# Patient Record
Sex: Male | Born: 1995 | Race: Black or African American | Hispanic: No | Marital: Single | Smoking: Current some day smoker
Health system: Southern US, Community
[De-identification: ages and names within clinical notes are randomized; demographics above are authoritative.]

## PROBLEM LIST (undated history)

## (undated) DIAGNOSIS — S66901A Unspecified injury of unspecified muscle, fascia and tendon at wrist and hand level, right hand, initial encounter: Secondary | ICD-10-CM

## (undated) DIAGNOSIS — F1290 Cannabis use, unspecified, uncomplicated: Secondary | ICD-10-CM

## (undated) DIAGNOSIS — W3400XA Accidental discharge from unspecified firearms or gun, initial encounter: Secondary | ICD-10-CM

## (undated) DIAGNOSIS — F17200 Nicotine dependence, unspecified, uncomplicated: Secondary | ICD-10-CM

## (undated) DIAGNOSIS — S52022B Displaced fracture of olecranon process without intraarticular extension of left ulna, initial encounter for open fracture type I or II: Secondary | ICD-10-CM

## (undated) DIAGNOSIS — S6291XB Unspecified fracture of right wrist and hand, initial encounter for open fracture: Secondary | ICD-10-CM

---
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
## 2018-02-04 ENCOUNTER — Encounter (HOSPITAL_COMMUNITY): Payer: Self-pay | Admitting: *Deleted

## 2018-02-04 ENCOUNTER — Emergency Department (HOSPITAL_COMMUNITY)
Admission: EM | Admit: 2018-02-04 | Discharge: 2018-02-04 | Disposition: A | Discharge status: Home / Self Care | Payer: BLUE CROSS/BLUE SHIELD | Attending: Emergency Medicine | Admitting: Emergency Medicine

## 2018-02-04 ENCOUNTER — Other Ambulatory Visit: Payer: Self-pay

## 2018-02-04 DIAGNOSIS — M791 Myalgia, unspecified site: Secondary | ICD-10-CM | POA: Insufficient documentation

## 2018-02-04 DIAGNOSIS — R509 Fever, unspecified: Secondary | ICD-10-CM | POA: Diagnosis present

## 2018-02-04 DIAGNOSIS — R05 Cough: Secondary | ICD-10-CM | POA: Diagnosis not present

## 2018-02-04 DIAGNOSIS — R69 Illness, unspecified: Secondary | ICD-10-CM

## 2018-02-04 DIAGNOSIS — Z79899 Other long term (current) drug therapy: Secondary | ICD-10-CM | POA: Insufficient documentation

## 2018-02-04 DIAGNOSIS — J111 Influenza due to unidentified influenza virus with other respiratory manifestations: Secondary | ICD-10-CM | POA: Insufficient documentation

## 2018-02-04 DIAGNOSIS — R1110 Vomiting, unspecified: Secondary | ICD-10-CM | POA: Diagnosis not present

## 2018-02-04 DIAGNOSIS — R070 Pain in throat: Secondary | ICD-10-CM | POA: Diagnosis not present

## 2018-02-04 LAB — RAPID STREP SCREEN (MED CTR MEBANE ONLY): Streptococcus, Group A Screen (Direct): NEGATIVE

## 2018-02-04 MED ORDER — ACETAMINOPHEN 500 MG PO TABS
1000.0000 mg | ORAL_TABLET | Freq: Once | ORAL | Status: AC | PRN
  Administered 2018-02-04: 975 mg via ORAL

## 2018-02-04 MED ORDER — ACETAMINOPHEN 325 MG PO TABS
650.0000 mg | ORAL_TABLET | Freq: Once | ORAL | Status: DC | PRN
  Filled 2018-02-04 (×2): qty 2

## 2018-02-04 MED ORDER — OSELTAMIVIR PHOSPHATE 75 MG PO CAPS: 75.0000 mg | ORAL_CAPSULE | Freq: Two times a day (BID) | ORAL | 0 refills | Status: DC

## 2018-02-04 NOTE — ED Provider Notes (Signed)
Patient placed in Quick Look pathway, seen and evaluated   Chief Complaint: Influenza-like symptoms  HPI:   Patient resents the ED with flulike symptoms.  Complains of headache, fever, sore throat, cough, body aches and rhinorrhea.  States that his symptoms started approximately 3 days ago.  Does report an episode of vomiting last night.  Denies any associated diarrhea or abdominal pain.  Has not taking for symptoms day prior to arrival.  Denies any known sick contacts.  Did not receive influenza vaccine.  ROS: Fever, chills, body aches, sore throat, cough, rhinorrhea (one)  Physical Exam:   Gen: No distress  Neuro: Awake and Alert  Skin: Warm    Focused Exam: TMs are normal.  Oropharynx with mild erythema no edema or exudate.  Rhinorrhea present.  Lungs clear to auscultation bilaterally.  Tachycardia noted otherwise regular rhythm without any rubs murmurs or gallops.  No focal abdominal tenderness.  No cervical lymphadenopathy.  No meningeal signs.   Initiation of care has begun. The patient has been counseled on the process, plan, and necessity for staying for the completion/evaluation, and the remainder of the medical screening examination   Discussed with the patient that exiting the department prior to completion of the work-up is AMA and there is no guarantee that there are no emergency medical conditions present.     Rise MuLeaphart, Shonte Soderlund T, PA-C 02/04/18 1448    Donnetta Hutchingook, Brian, MD 02/05/18 [phone]

## 2018-02-04 NOTE — ED Notes (Signed)
Pt. alert & interactive during discharge; pt. ambulatory to exit 

## 2018-02-04 NOTE — ED Triage Notes (Signed)
Pt reports flu like symptoms x 3 days with headache, fever, sore throat, cough and body aches. Had n/v last night. Mask on pt at triage.
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
## 2018-02-04 NOTE — Discharge Instructions (Signed)
Return to the ED with any concerns including difficulty breathing, vomiting and not able to keep down liquids, decreased urine output, decreased level of alertness/lethargy, or any other alarming symptoms  °

## 2018-02-07 LAB — CULTURE, GROUP A STREP (THRC)

## 2020-07-18 ENCOUNTER — Emergency Department (HOSPITAL_BASED_OUTPATIENT_CLINIC_OR_DEPARTMENT_OTHER)
Admission: EM | Admit: 2020-07-18 | Discharge: 2020-07-18 | Disposition: A | Discharge status: Home / Self Care | Payer: BC Managed Care – PPO | Attending: Emergency Medicine | Admitting: Emergency Medicine

## 2020-07-18 ENCOUNTER — Other Ambulatory Visit: Payer: Self-pay

## 2020-07-18 ENCOUNTER — Emergency Department (HOSPITAL_BASED_OUTPATIENT_CLINIC_OR_DEPARTMENT_OTHER): Payer: BC Managed Care – PPO

## 2020-07-18 ENCOUNTER — Encounter (HOSPITAL_BASED_OUTPATIENT_CLINIC_OR_DEPARTMENT_OTHER): Payer: Self-pay | Admitting: *Deleted

## 2020-07-18 DIAGNOSIS — J9801 Acute bronchospasm: Secondary | ICD-10-CM | POA: Insufficient documentation

## 2020-07-18 DIAGNOSIS — U071 COVID-19: Secondary | ICD-10-CM | POA: Diagnosis not present

## 2020-07-18 DIAGNOSIS — R05 Cough: Secondary | ICD-10-CM | POA: Diagnosis present

## 2020-07-18 LAB — SARS CORONAVIRUS 2 BY RT PCR (HOSPITAL ORDER, PERFORMED IN ~~LOC~~ HOSPITAL LAB): SARS Coronavirus 2: POSITIVE — AB

## 2020-07-18 MED ORDER — ALBUTEROL SULFATE HFA 108 (90 BASE) MCG/ACT IN AERS
2.0000 | INHALATION_SPRAY | RESPIRATORY_TRACT | Status: DC | PRN
  Administered 2020-07-18: 2 via RESPIRATORY_TRACT
  Filled 2020-07-18: qty 6.7

## 2020-07-18 NOTE — ED Triage Notes (Signed)
Cough, stuffy nose and chills since yesterday.

## 2020-07-18 NOTE — ED Notes (Signed)
Date and time results received: 07/18/20 2103 (use smartphrase ".now" to insert current time)  Test: COVID-19 Critical Value: Positive  Name of Provider Notified: Dr. Deretha Emory  Orders Received? Or Actions Taken?: no new orders
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
## 2021-09-25 ENCOUNTER — Ambulatory Visit
Admission: EM | Admit: 2021-09-25 | Discharge: 2021-09-25 | Disposition: A | Discharge status: Home / Self Care | Payer: Managed Care, Other (non HMO) | Attending: Emergency Medicine | Admitting: Emergency Medicine

## 2021-09-25 ENCOUNTER — Other Ambulatory Visit: Payer: Self-pay

## 2021-09-25 ENCOUNTER — Encounter: Payer: Self-pay | Admitting: Emergency Medicine

## 2021-09-25 DIAGNOSIS — R360 Urethral discharge without blood: Secondary | ICD-10-CM | POA: Insufficient documentation

## 2021-09-25 DIAGNOSIS — Z202 Contact with and (suspected) exposure to infections with a predominantly sexual mode of transmission: Secondary | ICD-10-CM | POA: Insufficient documentation

## 2021-09-25 MED ORDER — DOXYCYCLINE HYCLATE 100 MG PO CAPS: 100.0000 mg | ORAL_CAPSULE | Freq: Two times a day (BID) | ORAL | 0 refills | Status: AC

## 2021-09-25 NOTE — Discharge Instructions (Addendum)
Please begin doxycycline, 1 tablet in the morning 1 tablet in the evening for the next 7 days.  You will be notified of your STD screening test results once they are available, these usually takes 3 to 5 days.  If further treatment is needed, this will be provided for you as well.
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
## 2021-09-25 NOTE — ED Triage Notes (Signed)
Pt c/o penile discharge. States he was treated for positive chlamydia test about 2 months ago. Symptoms resolved and then returned about 2 weeks ago.

## 2021-09-26 LAB — URINE CYTOLOGY ANCILLARY ONLY
Chlamydia: NEGATIVE
Comment: NEGATIVE
Comment: NEGATIVE
Comment: NORMAL
Neisseria Gonorrhea: NEGATIVE
Trichomonas: NEGATIVE

## 2021-10-10 IMAGING — DX DG CHEST 1V PORT
2 series · 2 of 2 positions shown · non-contrast
Comparison: 09/24/2015

CLINICAL DATA: Cough COVID positive

EXAM:
PORTABLE CHEST 1 VIEW

[chest ap (1 of 2)]
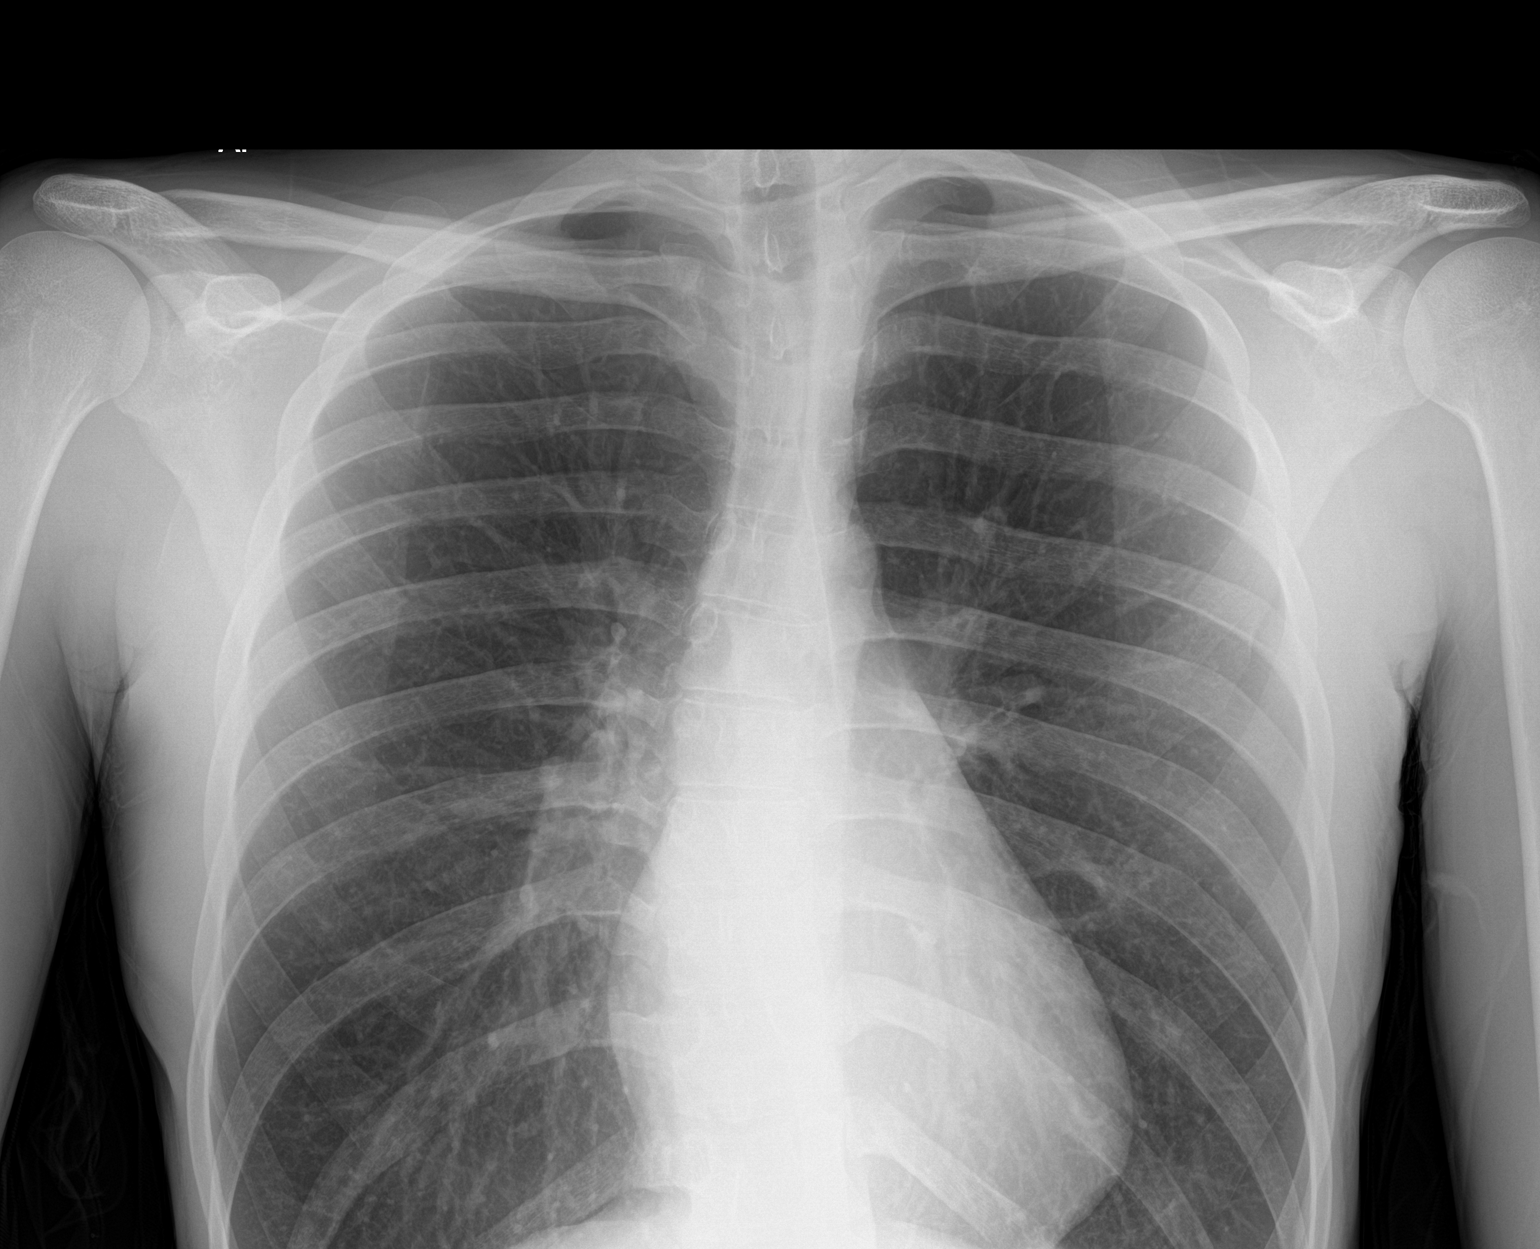

[chest ap (2 of 2)]
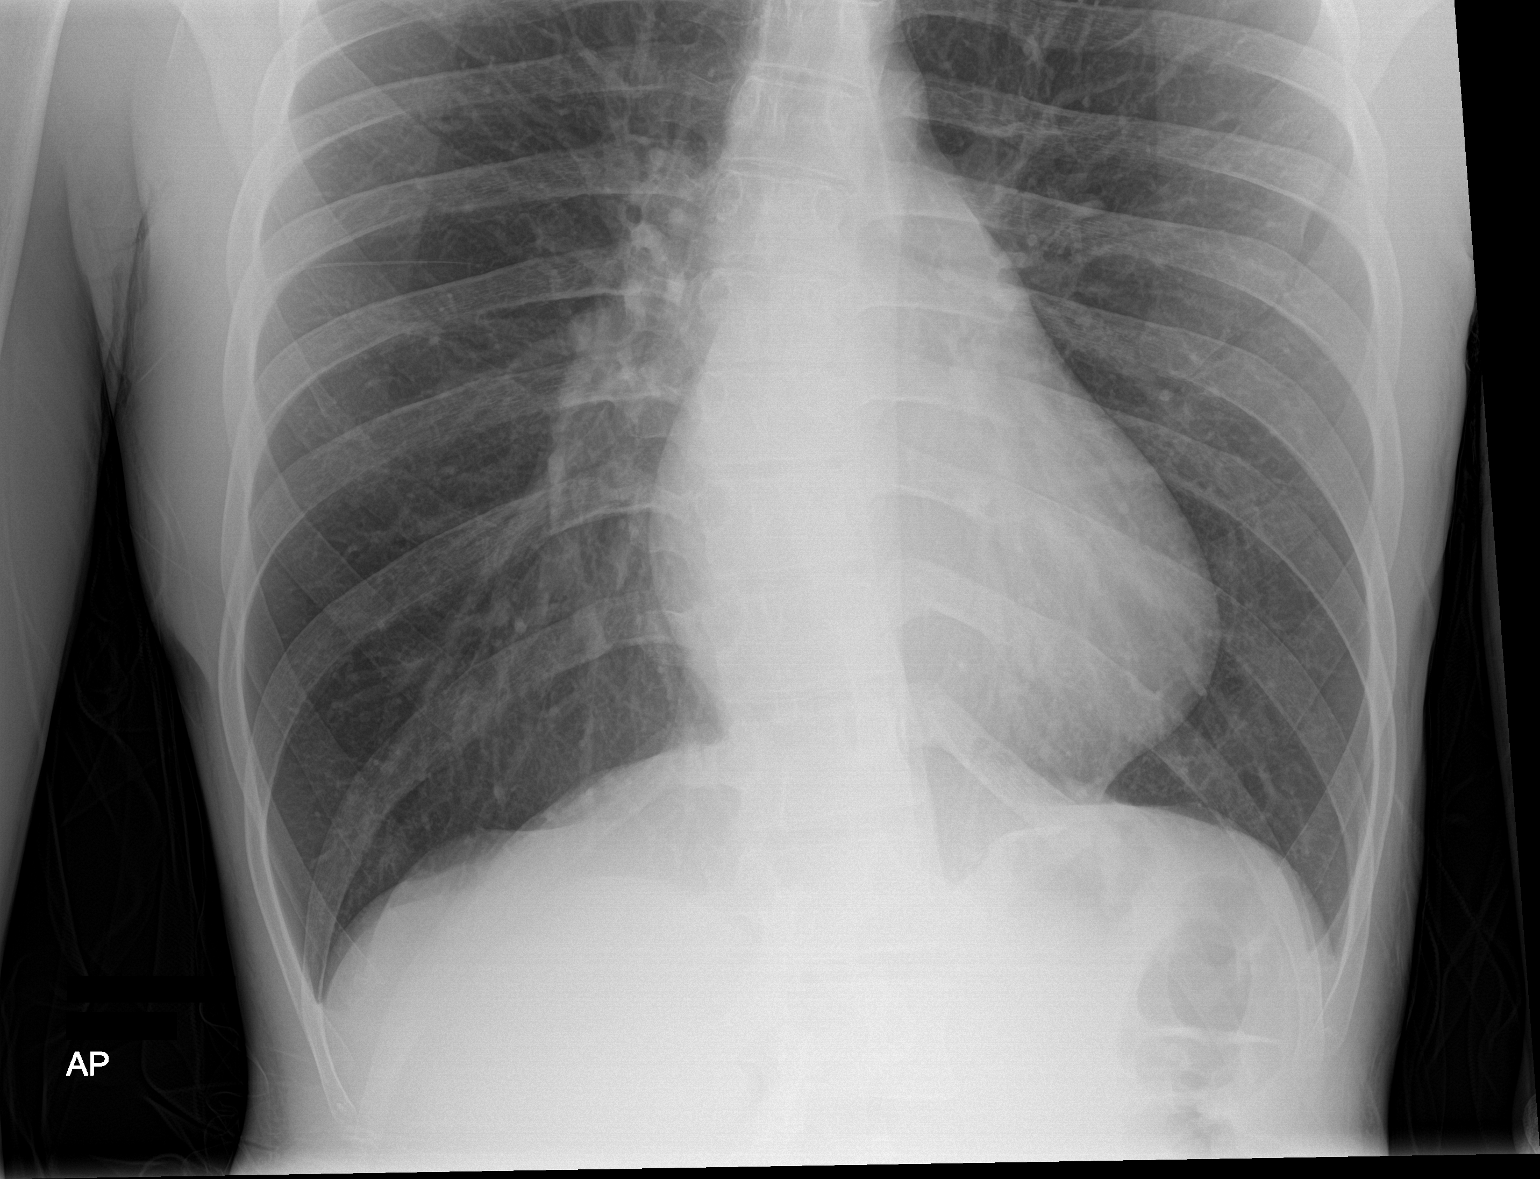

[2 of 2 positions shown; findings below may reference images not displayed]

FINDINGS: The heart size and mediastinal contours are within normal limits.
Both lungs are clear. The visualized skeletal structures are
unremarkable.
IMPRESSION: No active disease.

## 2021-11-17 ENCOUNTER — Ambulatory Visit
Admission: EM | Admit: 2021-11-17 | Discharge: 2021-11-17 | Disposition: A | Discharge status: Home / Self Care | Payer: Managed Care, Other (non HMO) | Attending: Emergency Medicine | Admitting: Emergency Medicine

## 2021-11-17 ENCOUNTER — Other Ambulatory Visit: Payer: Self-pay

## 2021-11-17 DIAGNOSIS — Z202 Contact with and (suspected) exposure to infections with a predominantly sexual mode of transmission: Secondary | ICD-10-CM | POA: Insufficient documentation

## 2021-11-17 MED ORDER — DOXYCYCLINE HYCLATE 100 MG PO CAPS: 100.0000 mg | ORAL_CAPSULE | Freq: Two times a day (BID) | ORAL | 0 refills | Status: AC

## 2021-11-17 NOTE — ED Triage Notes (Signed)
Pt c/o penile discharge, and states his partner did not fully complete treatment of Chlamydia.
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
## 2021-11-17 NOTE — Discharge Instructions (Addendum)
Please begin doxycycline now, prescription has been sent to your pharmacy.  The results of your STD testing today will be made available to you once received.  They will be posted to your MyChart and, if any of your results are abnormal, you will receive a phone call with those results along with further instructions regarding treatment.  Thank you for visiting urgent care.  If your STD testing comes back positive today, please feel free to follow-up in 1 week for repeat testing to ensure your infection is cleared after you finish your doxycycline.

## 2021-11-18 LAB — CYTOLOGY, (ORAL, ANAL, URETHRAL) ANCILLARY ONLY
Chlamydia: NEGATIVE
Comment: NEGATIVE
Comment: NEGATIVE
Comment: NORMAL
Neisseria Gonorrhea: NEGATIVE
Trichomonas: NEGATIVE

## 2022-03-23 ENCOUNTER — Ambulatory Visit
Admission: EM | Admit: 2022-03-23 | Discharge: 2022-03-23 | Disposition: A | Discharge status: Home / Self Care | Payer: Managed Care, Other (non HMO) | Attending: Emergency Medicine | Admitting: Emergency Medicine

## 2022-03-23 DIAGNOSIS — Z202 Contact with and (suspected) exposure to infections with a predominantly sexual mode of transmission: Secondary | ICD-10-CM | POA: Diagnosis present

## 2022-03-23 MED ORDER — DOXYCYCLINE HYCLATE 100 MG PO CAPS: 100.0000 mg | ORAL_CAPSULE | Freq: Two times a day (BID) | ORAL | 0 refills | Status: AC

## 2022-03-23 NOTE — ED Triage Notes (Signed)
Pt c/o penile discharge and states he was exposed to chlamydia.  ?Started: 2.5 weeks ago.  ?
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
## 2022-03-23 NOTE — Discharge Instructions (Addendum)
Based on the history that you provided to me today, you were treated empirically for chlamydia with a prescription for doxycycline, 1 tablet twice daily for the next 7 days.  Please take all tablets as prescribed, do not skip doses.  Failure to take all doses as prescribed can result in a worsening infection that will be more difficult to treat and resolve.  Please abstain from sexual intercourse for 7 days. ?  ?The results of your STD testing today will be made available to you once they are complete, this typically takes 3 to 5 days.  They will initially be posted to your MyChart and, if any of your results are abnormal, you will receive a phone call with those results along with further instructions regarding any further treatment, if needed.  ?  ?If you have not had complete resolution of your symptoms after completing treatment, please return for repeat evaluation. ?  ?Thank you for visiting urgent care today.  I appreciate the opportunity to participate in your care. ? ?

## 2022-03-25 LAB — CYTOLOGY, (ORAL, ANAL, URETHRAL) ANCILLARY ONLY
Chlamydia: NEGATIVE
Comment: NEGATIVE
Comment: NEGATIVE
Comment: NORMAL
Neisseria Gonorrhea: NEGATIVE
Trichomonas: NEGATIVE

## 2022-07-20 ENCOUNTER — Ambulatory Visit
Admission: EM | Admit: 2022-07-20 | Discharge: 2022-07-20 | Disposition: A | Discharge status: Home / Self Care | Payer: Managed Care, Other (non HMO) | Attending: Family Medicine | Admitting: Family Medicine

## 2022-07-20 DIAGNOSIS — N342 Other urethritis: Secondary | ICD-10-CM | POA: Insufficient documentation

## 2022-07-20 DIAGNOSIS — Z202 Contact with and (suspected) exposure to infections with a predominantly sexual mode of transmission: Secondary | ICD-10-CM | POA: Insufficient documentation

## 2022-07-20 MED ORDER — DOXYCYCLINE HYCLATE 100 MG PO CAPS: 100.0000 mg | ORAL_CAPSULE | Freq: Two times a day (BID) | ORAL | 0 refills | Status: DC

## 2022-07-20 NOTE — Discharge Instructions (Addendum)
Take doxycycline 100 mg --1 capsule 2 times daily for 7 days  Staff will notify you of any positives on the swab.   

## 2022-07-20 NOTE — ED Triage Notes (Signed)
The patient states his partner tested positive for chlamydia, he states he had some penile discomfort.   Started: 4 days ago
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
## 2022-07-21 LAB — CYTOLOGY, (ORAL, ANAL, URETHRAL) ANCILLARY ONLY
Chlamydia: NEGATIVE
Comment: NEGATIVE
Comment: NEGATIVE
Comment: NORMAL
Neisseria Gonorrhea: NEGATIVE
Trichomonas: NEGATIVE

## 2022-07-26 ENCOUNTER — Ambulatory Visit
Admission: EM | Admit: 2022-07-26 | Discharge: 2022-07-26 | Disposition: A | Discharge status: Home / Self Care | Payer: Managed Care, Other (non HMO) | Attending: Emergency Medicine | Admitting: Emergency Medicine

## 2022-07-26 DIAGNOSIS — L738 Other specified follicular disorders: Secondary | ICD-10-CM | POA: Diagnosis not present

## 2022-07-26 DIAGNOSIS — B360 Pityriasis versicolor: Secondary | ICD-10-CM | POA: Diagnosis not present

## 2022-07-26 MED ORDER — MUPIROCIN 2 % EX OINT: TOPICAL_OINTMENT | CUTANEOUS | 1 refills | Status: DC

## 2022-07-26 MED ORDER — SELENIUM SULFIDE 2.25 % EX SHAM: MEDICATED_SHAMPOO | CUTANEOUS | 1 refills | Status: DC

## 2022-07-26 MED ORDER — CEPHALEXIN 500 MG PO CAPS: 500.0000 mg | ORAL_CAPSULE | Freq: Four times a day (QID) | ORAL | 0 refills | Status: AC

## 2022-07-26 NOTE — Discharge Instructions (Addendum)
To treat and reduce the lesions that you are experiencing in your groin area at this time, recommend the following:  Please begin Keflex 1 tablet 4 times daily for the next 5 days.  If you would like to take 2 tablets the morning 2 tablets in evening that is fine to.  Please apply mupirocin ointment to all affected areas twice daily.  Please also use a Q-tip to apply mupirocin ointment to the inside of each side of your nose, using a separate Q-tip for each side.  Please do this to once daily for the next 7 days then continue doing this once weekly.  For the next 7 days I want you to prewash with selenium sulfide shampoo.  Before you get wet in the shower, please apply the shampoo to the affected areas of your skin in your groin and bottom and leave it in place for 10 minutes.  Then get into the shower and rinse the shampoo off.  Please continue doing this once weekly once you have completed the first 7 days of treatment.  Please be sure that you are washing your sheets and your underclothing in hot water, sheets should be washed at least every 2 weeks under clothing should always be washed before you wear it.  This also includes socks.  Hygiene at home and out in the community is also very important.  Is important that you wash your hands thoroughly with soap and water after every trip to the bathroom, prior to eating or when they are visibly dirty.  Be sure that you use soap to lather your hands for at least 15 seconds prior to rinsing them in warm not cold water.  Bleach baths monthly may also significantly reduce the frequency of lesions that appear in your skin.  Please fill your bathtub with warm but not to hot water and add 1/4 cup of laundry bleach.  Soak in the water for 20 minutes then stand to shower and wash the bleach from your body using your favorite soap.  Thank you for visiting urgent care today.  Please let us know if this does not completely resolve your issue.
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
## 2022-07-26 NOTE — ED Triage Notes (Signed)
The pt right sided c/o ruptured groin abscess. The patient states the drainage has a foul smell. The patient states the abscess ruptured about 6-7 days ago.

## 2022-09-29 ENCOUNTER — Emergency Department (HOSPITAL_BASED_OUTPATIENT_CLINIC_OR_DEPARTMENT_OTHER)
Admission: EM | Admit: 2022-09-29 | Discharge: 2022-09-29 | Disposition: A | Discharge status: Home / Self Care | Payer: Managed Care, Other (non HMO) | Attending: Emergency Medicine | Admitting: Emergency Medicine

## 2022-09-29 ENCOUNTER — Encounter (HOSPITAL_BASED_OUTPATIENT_CLINIC_OR_DEPARTMENT_OTHER): Payer: Self-pay | Admitting: Emergency Medicine

## 2022-09-29 ENCOUNTER — Other Ambulatory Visit: Payer: Self-pay

## 2022-09-29 DIAGNOSIS — R1031 Right lower quadrant pain: Secondary | ICD-10-CM | POA: Diagnosis present

## 2022-09-29 DIAGNOSIS — L02214 Cutaneous abscess of groin: Secondary | ICD-10-CM | POA: Insufficient documentation

## 2022-09-29 DIAGNOSIS — L0291 Cutaneous abscess, unspecified: Secondary | ICD-10-CM

## 2022-09-29 MED ORDER — DOXYCYCLINE HYCLATE 100 MG PO CAPS: 100.0000 mg | ORAL_CAPSULE | Freq: Two times a day (BID) | ORAL | 0 refills | Status: AC

## 2022-09-29 MED ORDER — LIDOCAINE HCL (PF) 1 % IJ SOLN
5.0000 mL | Freq: Once | INTRAMUSCULAR | Status: AC
  Administered 2022-09-29: 5 mL
  Filled 2022-09-29: qty 5

## 2022-09-29 NOTE — Discharge Instructions (Addendum)
It was a pleasure taking care of you today!  The abscess was drained today in the emergency department.  You will be sent a prescription for doxycycline, take as prescribed. The area will continue to drain on its own. It is important that you follow-up with your primary care provider regarding today's ED visit.  Return to the emergency department if you are experiencing increasing/worsening symptoms.

## 2022-09-29 NOTE — ED Provider Notes (Signed)
[place] EMERGENCY DEPARTMENT Provider Note   CSN: [id number] Arrival date & time: 09/29/22  5400     History  Chief Complaint  Patient presents with   Groin Pain    Damon Hoffman is a 26 y.o. male who presents emergency department with concerns for bilateral groin pain.  He notes that he has swelling noted to bilateral regions.  Denies concern at this time for STDs.  No meds tried prior to arrival.  Denies penile discharge/pain/swelling, testicular pain/swelling, dysuria, hematuria.  Notes allergy to sulfa antibiotics.  Denies history of diabetes.  The history is provided by the patient. No language interpreter was used.       Home Medications Prior to Admission medications   Medication Sig Start Date End Date Taking? Authorizing Provider  doxycycline (VIBRAMYCIN) 100 MG capsule Take 1 capsule (100 mg total) by mouth 2 (two) times daily for 5 days. 09/29/22 10/04/22 Yes Osmar Howton A, PA-C  mupirocin ointment (BACTROBAN) 2 % For decolonization, apply with cotton tip applicator to each nare once daily for 7 days, then apply once weekly.  For abscess treatment, apply to affected area twice daily for 10 days. 07/26/22   Lynden Oxford Scales, PA-C  Selenium Sulfide 2.25 % SHAM Prior to showering, apply to all affected areas, leave in place for 10 minutes, then shower and rinse.  Repeat for 7 days.  Then continue use once weekly 07/26/22   Lynden Oxford Scales, PA-C  cetirizine (ZYRTEC) 10 MG tablet Take 1 tablet (10 mg total) by mouth daily. 03/14/13 07/18/20  Orma Flaming, MD      Allergies    Sulfa antibiotics    Review of Systems   Review of Systems  Gastrointestinal:  Negative for abdominal pain.  Genitourinary:  Negative for dysuria, hematuria, penile discharge, penile pain, penile swelling, scrotal swelling and testicular pain.  All other systems reviewed and are negative.   Physical Exam Updated Vital Signs BP 120/79   Pulse 89   Temp 98.4 F (36.9 C)  (Oral)   Resp 18   Ht 6' (1.829 m)   Wt 68.5 kg   SpO2 100%   BMI 20.48 kg/m  Physical Exam Vitals and nursing note reviewed. Exam conducted with a chaperone present.  Constitutional:      General: He is not in acute distress.    Appearance: Normal appearance. He is not ill-appearing.  HENT:     Head: Normocephalic and atraumatic.     Right Ear: External ear normal.     Left Ear: External ear normal.  Eyes:     General: No scleral icterus. Cardiovascular:     Rate and Rhythm: Normal rate and regular rhythm.     Pulses: Normal pulses.     Heart sounds: Normal heart sounds.  Pulmonary:     Effort: Pulmonary effort is normal.     Breath sounds: Normal breath sounds.  Abdominal:     General: Abdomen is flat. Bowel sounds are normal. There is no distension.     Palpations: Abdomen is soft. There is no mass.     Tenderness: There is no abdominal tenderness.     Hernia: There is no hernia in the left inguinal area or right inguinal area.  Genitourinary:    Penis: Normal.      Testes: Normal. Cremasteric reflex is present.        Right: Mass, tenderness or swelling not present. Right testis is descended.  Left: Mass, tenderness or swelling not present. Left testis is descended.     Epididymis:     Right: Normal.     Left: Normal.     Comments: NT chaperone present for exam.  No tenderness to palpation noted to bilateral testicles, no swelling noted to bilateral testicles.  No drainage, tenderness, swelling noted to penis.  Area of fluctuance noted to left inguinal region.  Area of induration noted to right inguinal region with mild tenderness to palpation to the area.  No tenderness to palpation noted to the perineum.  No overlying erythema noted. Musculoskeletal:        General: Normal range of motion.     Cervical back: Normal range of motion and neck supple.  Lymphadenopathy:     Lower Body: No right inguinal adenopathy. No left inguinal adenopathy.  Skin:    General:  Skin is warm and dry.     Findings: No rash.  Neurological:     Mental Status: He is alert.     ED Results / Procedures / Treatments   Labs (all labs ordered are listed, but only abnormal results are displayed) Labs Reviewed - No data to display  EKG None  Radiology No results found.  Procedures .Marland KitchenIncision and Drainage  Date/Time: 09/29/2022 11:57 AM  Performed by: Cyndee Brightly, Lamis Behrmann A, PA-C Authorized by: Karenann Cai, PA-C   Consent:    Consent obtained:  Verbal   Consent given by:  Patient   Risks discussed:  Bleeding, pain and incomplete drainage   Alternatives discussed:  No treatment Universal protocol:    Patient identity confirmed:  Verbally with patient and hospital-assigned identification number Location:    Type:  Abscess   Size:  3 cm   Location:  Anogenital   Anogenital location: left inguinal. Pre-procedure details:    Skin preparation:  Povidone-iodine Sedation:    Sedation type:  None Anesthesia:    Anesthesia method:  Local infiltration   Local anesthetic:  Lidocaine 1% w/o epi Procedure type:    Complexity:  Simple Procedure details:    Ultrasound guidance: no     Needle aspiration: no     Incision types:  Single straight   Incision depth:  Dermal   Wound management:  Probed and deloculated and irrigated with saline   Drainage:  Purulent   Drainage amount:  Moderate   Wound treatment:  Wound left open   Packing materials:  None Post-procedure details:    Procedure completion:  Tolerated well, no immediate complications     Medications Ordered in ED Medications  lidocaine (PF) (XYLOCAINE) 1 % injection 5 mL (5 mLs Infiltration Given by Other 09/29/22 1051)    ED Course/ Medical Decision Making/ A&P Clinical Course as of 09/29/22 1157  Tue Sep 29, 2022  1030 Shared decision making with patient regarding drainage of the abscess to the left inguinal region and discharging home with antibiotics versus labs, CT scan, drainage of the left  inguinal abscess and discharged with antibiotics.  At this time patient notes that he would prefer to have the I&D of the left inguinal abscess and be discharged home with antibiotics.  Patient knows to follow-up closely in the emergency department if worsening symptoms. [SB]    Clinical Course User Index [SB] Genetta Fiero A, PA-C                           Medical Decision Making Risk Prescription drug management.  Pt presents with bilateral groin pain onset 3 days.  No drainage noted to the area.  No history of diabetes. Vital signs, patient afebrile. On exam, pt with NT chaperone present for exam.  No tenderness to palpation noted to bilateral testicles, no swelling noted to bilateral testicles.  No drainage, tenderness, swelling noted to penis.  Area of fluctuance noted to left inguinal region.  Area of induration noted to right inguinal region with mild tenderness to palpation to the area.  No tenderness to palpation noted to the perineum.  No overlying erythema noted. No acute cardiovascular, respiratory, abdominal exam findings. Differential diagnosis includes abscess, cellulitis, Fournier's gangrene.   Disposition: Presentation suspicious for abscess.  Doubt cellulitis or Fournier's gangrene at this time. After consideration of the diagnostic results and the patients response to treatment, I feel that the patient would benefit from Discharge home.  Patient will be discharged home with a prescription for doxycycline.  Also instructed to follow-up with her primary care provider regarding today's ED visit.  Patient given strict instructions to return to the emergency department if increasing/worsening symptoms.  Supportive care measures and strict return precautions discussed with patient at bedside. Pt acknowledges and verbalizes understanding. Pt appears safe for discharge. Follow up as indicated in discharge paperwork.    This chart was dictated using voice recognition software, Dragon.  Despite the best efforts of this provider to proofread and correct errors, errors may still occur which can change documentation meaning.   Final Clinical Impression(s) / ED Diagnoses Final diagnoses:  Abscess    Rx / DC Orders ED Discharge Orders          Ordered    doxycycline (VIBRAMYCIN) 100 MG capsule  2 times daily        09/29/22 1155              Osie Merkin A, PA-C 09/29/22 1158    Tegeler, Canary Brim, MD 09/29/22 1451

## 2022-09-29 NOTE — ED Triage Notes (Signed)
Bilateral groin pain x 3 days , possible swollen lymph nodes, worse with ambulation

## 2023-02-25 ENCOUNTER — Emergency Department (HOSPITAL_BASED_OUTPATIENT_CLINIC_OR_DEPARTMENT_OTHER)
Admission: EM | Admit: 2023-02-25 | Discharge: 2023-02-25 | Disposition: A | Discharge status: Home / Self Care | Payer: Managed Care, Other (non HMO) | Attending: Emergency Medicine | Admitting: Emergency Medicine

## 2023-02-25 ENCOUNTER — Other Ambulatory Visit: Payer: Self-pay

## 2023-02-25 ENCOUNTER — Encounter (HOSPITAL_BASED_OUTPATIENT_CLINIC_OR_DEPARTMENT_OTHER): Payer: Self-pay | Admitting: Pediatrics

## 2023-02-25 ENCOUNTER — Emergency Department (HOSPITAL_BASED_OUTPATIENT_CLINIC_OR_DEPARTMENT_OTHER): Payer: Managed Care, Other (non HMO)

## 2023-02-25 DIAGNOSIS — N50811 Right testicular pain: Secondary | ICD-10-CM | POA: Diagnosis present

## 2023-02-25 DIAGNOSIS — N492 Inflammatory disorders of scrotum: Secondary | ICD-10-CM | POA: Insufficient documentation

## 2023-02-25 MED ORDER — BACITRACIN ZINC 500 UNIT/GM EX OINT
TOPICAL_OINTMENT | Freq: Once | CUTANEOUS | Status: AC
  Administered 2023-02-25: 31.5 via TOPICAL
  Filled 2023-02-25: qty 28.35

## 2023-02-25 MED ORDER — DOXYCYCLINE HYCLATE 100 MG PO TABS
100.0000 mg | ORAL_TABLET | Freq: Once | ORAL | Status: AC
  Administered 2023-02-25: 100 mg via ORAL
  Filled 2023-02-25: qty 1

## 2023-02-25 MED ORDER — DOXYCYCLINE HYCLATE 100 MG PO CAPS: 100.0000 mg | ORAL_CAPSULE | Freq: Two times a day (BID) | ORAL | 0 refills | Status: DC

## 2023-02-25 MED ORDER — BACITRACIN ZINC 500 UNIT/GM EX OINT: 1.0000 | TOPICAL_OINTMENT | Freq: Two times a day (BID) | CUTANEOUS | 0 refills | Status: DC

## 2023-02-25 NOTE — ED Notes (Signed)
Discharge paperwork reviewed entirely with patient, including Rx's and follow up care. Pain was under control. Pt verbalized understanding as well as all parties involved. No questions or concerns voiced at the time of discharge. No acute distress noted.   Pt ambulated out to PVA without incident or assistance.  

## 2023-02-25 NOTE — Discharge Instructions (Signed)
Take doxycycline twice a day for a week.  You may also apply bacitracin twice daily   Please wear loose clothing  See your doctor for follow-up  Return to ER if you have worse purulent drainage, fever, scrotal pain

## 2023-02-25 NOTE — ED Notes (Signed)
Pt is currently in ultrasound.

## 2023-02-25 NOTE — ED Triage Notes (Signed)
C/O bilateral groin pain x 1.5 week; reports some swelling on the left side

## 2023-02-25 NOTE — ED Provider Notes (Signed)
[place] [facility] Provider Note   CSN: [id number] Arrival date & time: 02/25/23  1817     History  Chief Complaint  Patient presents with   Groin Pain    Damon Hoffman is a 27 y.o. male here presenting with testicular pain.  Patient has history of abscess requiring drainage.  Patient states that he has been sweating a lot and noticed pain bilateral testicles.  He states that the right was more painful and he noticed some purulent drainage.  He states that he does shave.  He states that this time, the left side is more painful.  He states that the last time he had sex was about 3 weeks ago.  He is not concerned for STD and has no penile discharge or dysuria.  The history is provided by the patient.       Home Medications Prior to Admission medications   Medication Sig Start Date End Date Taking? Authorizing Provider  mupirocin ointment (BACTROBAN) 2 % For decolonization, apply with cotton tip applicator to each nare once daily for 7 days, then apply once weekly.  For abscess treatment, apply to affected area twice daily for 10 days. 07/26/22   Lynden Oxford Scales, PA-C  Selenium Sulfide 2.25 % SHAM Prior to showering, apply to all affected areas, leave in place for 10 minutes, then shower and rinse.  Repeat for 7 days.  Then continue use once weekly 07/26/22   Lynden Oxford Scales, PA-C  cetirizine (ZYRTEC) 10 MG tablet Take 1 tablet (10 mg total) by mouth daily. 03/14/13 07/18/20  Orma Flaming, MD      Allergies    Sulfa antibiotics    Review of Systems   Review of Systems  Skin:  Positive for rash.  All other systems reviewed and are negative.   Physical Exam Updated Vital Signs BP 111/69 (BP Location: Left Arm)   Pulse 72   Temp 98.1 F (36.7 C) (Oral)   Resp 16   Ht 6' (1.829 m)   Wt 71.2 kg   SpO2 100%   BMI 21.29 kg/m  Physical Exam Vitals and nursing note reviewed.  HENT:     Head: Normocephalic.     Nose: Nose  normal.     Mouth/Throat:     Mouth: Mucous membranes are moist.  Eyes:     Extraocular Movements: Extraocular movements intact.     Pupils: Pupils are equal, round, and reactive to light.  Cardiovascular:     Rate and Rhythm: Normal rate.     Pulses: Normal pulses.  Pulmonary:     Effort: Pulmonary effort is normal.  Abdominal:     General: Abdomen is flat.  Genitourinary:    Comments: Patient has right scrotal cellulitis with no evidence of abscess.  Patient does have left scrotal abscess that is draining already.  There is some left inguinal lymphadenopathy.  No testicular tenderness. Musculoskeletal:     Cervical back: Normal range of motion.  Skin:    General: Skin is warm.     Capillary Refill: Capillary refill takes less than 2 seconds.  Neurological:     General: No focal deficit present.     Mental Status: He is alert and oriented to person, place, and time.  Psychiatric:        Mood and Affect: Mood normal.        Behavior: Behavior normal.     ED Results / Procedures / Treatments   Labs (  all labs ordered are listed, but only abnormal results are displayed) Labs Reviewed  URINALYSIS, ROUTINE W REFLEX MICROSCOPIC  GC/CHLAMYDIA PROBE AMP ([place]) NOT AT [facility]    EKG None  Radiology US SCROTUM W/DOPPLER  Result Date: 02/25/2023 CLINICAL DATA:  Bilateral groin swelling EXAM: SCROTAL ULTRASOUND DOPPLER ULTRASOUND OF THE TESTICLES TECHNIQUE: Complete ultrasound examination of the testicles, epididymis, and other scrotal structures was performed. Color and spectral Doppler ultrasound were also utilized to evaluate blood flow to the testicles. COMPARISON:  None Available. FINDINGS: Right testicle Measurements: 4.2 x 1.5 x 2.5 cm. Negative for mass. Positive for microlithiasis Left testicle Measurements: 3.6 x 2.2 x 2.7 cm. Negative for mass. Positive for microlithiasis. Right epididymis:  Normal in size and appearance. Left epididymis:  Left epididymal cyst measuring  11 mm. Hydrocele:  Small left hydrocele. Varicocele:  None visualized. Pulsed Doppler interrogation of both testes demonstrates normal low resistance arterial and venous waveforms bilaterally. Targeted ultrasound of the left inguinal swelling demonstrates heterogenous hypoechoic area involving the skin and subcutaneous soft tissues with hyperemia. IMPRESSION: 1. Negative for testicular mass or torsion. 2. 11 mm left epididymal cyst. 3. Small left hydrocele. 4. Bilateral testicular microlithiasis. Current literature suggests that testicular microlithiasis is not a significant independent risk factor for development of testicular carcinoma, and that follow up imaging is not warranted in the absence of other risk factors. Monthly testicular self-examination and annual physical exams are considered appropriate surveillance. If patient has other risk factors for testicular carcinoma, then referral to Urology should be considered. (Reference: A 5-Year Follow up Study of Asymptomatic Men with Testicular Microlithiasis. 5. Focal heterogenous hypoechoic soft tissue thickening involving skin and subcutaneous soft tissues in the left inguinal region in the area palpable mass, this could represent inflammatory tissue. No discrete abscess is visualized Electronically Signed   By: Donavan Foil M.D.   On: 02/25/2023 19:56    Procedures Procedures    Medications Ordered in ED Medications  doxycycline (VIBRA-TABS) tablet 100 mg (has no administration in time range)  bacitracin ointment (has no administration in time range)    ED Course/ Medical Decision Making/ A&P                             Medical Decision Making Damon Hoffman is a 27 y.o. male here presenting with bilateral testicular pain.  Clinically patient has a draining abscess in the left inguinal area.  Patient also has a cellulitis on the right inguinal area.  Patient has a previous abscess that required I&D so I am suspicious for MRSA.  Ultrasound  showed no mass or torsion or deep abscess.  Will give doxycycline empirically.  Told him to apply bacitracin as well.   Problems Addressed: Scrotal wall abscess: acute illness or injury  Amount and/or Complexity of Data Reviewed Labs: ordered. Decision-making details documented in ED Course.  Risk OTC drugs. Prescription drug management.    Final Clinical Impression(s) / ED Diagnoses Final diagnoses:  None    Rx / DC Orders ED Discharge Orders     None         Drenda Freeze, MD 02/25/23 2024

## 2023-03-10 ENCOUNTER — Ambulatory Visit (INDEPENDENT_AMBULATORY_CARE_PROVIDER_SITE_OTHER): Payer: Managed Care, Other (non HMO) | Admitting: Family Medicine

## 2023-03-10 ENCOUNTER — Encounter: Payer: Self-pay | Admitting: Family Medicine

## 2023-03-10 VITALS — BP 122/70 | HR 53 | Temp 97.6°F | Resp 18 | Ht 72.0 in | Wt 154.0 lb

## 2023-03-10 DIAGNOSIS — Z7689 Persons encountering health services in other specified circumstances: Secondary | ICD-10-CM

## 2023-03-10 DIAGNOSIS — N492 Inflammatory disorders of scrotum: Secondary | ICD-10-CM
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
## 2023-03-10 NOTE — Patient Instructions (Signed)
Thank you for choosing [facility] for your Primary Care needs. I am excited for the opportunity to partner with you to meet your health care goals. It was a pleasure meeting you today!  Information on diet, exercise, and health maintenance recommendations are listed below. This is information to help you be sure you are on track for optimal health and monitoring.   Please look over this and let us know if you have any questions or if you have completed any of the health maintenance outside of [place] so that we can be sure your records are up to date.  ___________________________________________________________  MyChart:  For all urgent or time sensitive needs we ask that you please call the office to avoid delays. Our number is [phone]. MyChart is not constantly monitored and due to the large volume of messages a day, replies may take up to 72 business hours.  MyChart Policy: MyChart allows for you to see your visit notes, after visit summary, provider recommendations, lab and tests results, make an appointment, request refills, and contact your provider or the office for non-urgent questions or concerns. Providers are seeing patients during normal business hours and do not have built in time to review MyChart messages.  We ask that you allow a minimum of 3 business days for responses to MyChart messages. For this reason, please do not send urgent requests through MyChart. Please call the office at [phone]. New and ongoing conditions may require a visit. We have virtual and in-person visits available for your convenience.  Complex MyChart concerns may require a visit. Your provider may request you schedule a virtual or in-person visit to ensure we are providing the best care possible. MyChart messages sent after 11:00 AM on Friday will not be received by the provider until Monday morning.    Lab and Test Results: You will receive your lab and test  results on MyChart as soon as they are completed and results have been sent by the lab or testing facility. Due to this service, you will receive your results BEFORE your provider.  I review lab and test results each morning prior to seeing patients. Some results require collaboration with other providers to ensure you are receiving the most appropriate care. For this reason, we ask that you please allow a minimum of 3-5 business days from the time that ALL results have been received for your provider to receive and review lab and test results and contact you about these.  Most lab and test result comments from the provider will be sent through MyChart. Your provider may recommend changes to the plan of care, follow-up visits, repeat testing, ask questions, or request an office visit to discuss these results. You may reply directly to this message or call the office to provide information for the provider or set up an appointment. In some instances, you will be called with test results and recommendations. Please let us know if this is preferred and we will make note of this in your chart to provide this for you.    If you have not heard a response to your lab or test results in 5 business days from all results returning to MyChart, please call the office to let us know. We ask that you please avoid calling prior to this time unless there is an emergent concern. Due to high call volumes, this can delay the resulting process.  After Hours: For all non-emergency after hours needs, please   call the office at [phone] and select the option to reach the on-call  service. On-call services are shared between multiple [facility] offices and therefore it will not be possible to speak directly with your provider. On-call providers may provide medical advice and recommendations, but are unable to provide refills for maintenance medications.  For all emergency or urgent medical needs after normal business hours, we  recommend that you seek care at the closest Urgent Care or Emergency Department to ensure appropriate treatment in a timely manner.  [facility] has a 24 hour emergency room located on the ground floor for your convenience.   Urgent Concerns During the Business Day Providers are seeing patients from 8AM to 5PM with a busy schedule and are most often not able to respond to non-urgent calls until the end of the day or the next business day. If you should have URGENT concerns during the day, please call and speak to the nurse or schedule a same day appointment so that we can address your concern without delay.   Thank you, again, for choosing me as your health care partner. I appreciate your trust and look forward to learning more about you!   Neytiri Asche B. Jermani Pund, DNP, FNP-C  ___________________________________________________________  Health Maintenance Recommendations Screening Testing Mammogram Every 1-2 years based on history and risk factors Starting at age 50 Pap Smear Ages 21-39 every 3 years Ages 30-65 every 5 years with HPV testing More frequent testing may be required based on results and history Colon Cancer Screening Every 1-10 years based on test performed, risk factors, and history Starting at age 45 Bone Density Screening Every 2-10 years based on history Starting at age 65 for women Recommendations for men differ based on medication usage, history, and risk factors AAA Screening One time ultrasound Men 65-75 years old who have ever smoked Lung Cancer Screening Low Dose Lung CT every 12 months Age 50-80 years with a 20 pack-year smoking history who still smoke or who have quit within the last 15 years  Screening Labs Routine  Labs: Complete Blood Count (CBC), Complete Metabolic Panel (CMP), Cholesterol (Lipid Panel) Every 6-12 months based on history and medications May be recommended more frequently based on current conditions or previous results Hemoglobin  A1c Lab Every 3-12 months based on history and previous results Starting at age 45 or earlier with diagnosis of diabetes, high cholesterol, BMI >26, and/or risk factors Frequent monitoring for patients with diabetes to ensure blood sugar control Thyroid Panel  Every 6 months based on history, symptoms, and risk factors May be repeated more often if on medication HIV One time testing for all patients 13 and older May be repeated more frequently for patients with increased risk factors or exposure Hepatitis C One time testing for all patients 18 and older May be repeated more frequently for patients with increased risk factors or exposure Gonorrhea, Chlamydia Every 12 months for all sexually active persons 13-24 years Additional monitoring may be recommended for those who are considered high risk or who have symptoms PSA Men 40-54 years old with risk factors Additional screening may be recommended from age 55-69 based on risk factors, symptoms, and history  Vaccine Recommendations Tetanus Booster All adults every 10 years Flu Vaccine All patients 6 months and older every year COVID Vaccine All patients 12 years and older Initial dosing with booster May recommend additional booster based on age and health history HPV Vaccine 2 doses all patients age 9-26 Dosing may be considered   for patients over 26 Shingles Vaccine (Shingrix) 2 doses all adults 50 years and older Pneumonia (Pneumovax 23) All adults 65 years and older May recommend earlier dosing based on health history Pneumonia (Prevnar 13) All adults 65 years and older Dosed 1 year after Pneumovax 23 Pneumonia (Prevnar 20) All adults 65 years and older (adults 19-64 with certain conditions or risk factors) 1 dose  For those who have not received Prevnar 13 vaccine previously   Additional Screening, Testing, and Vaccinations may be recommended on an individualized basis based on family history, health history, risk  factors, and/or exposure.  __________________________________________________________  Diet Recommendations for All Patients  I recommend that all patients maintain a diet low in saturated fats, carbohydrates, and cholesterol. While this can be challenging at first, it is not impossible and small changes can make big differences.  Things to try: Decreasing the amount of soda, sweet tea, and/or juice to one or less per day and replace with water While water is always the first choice, if you do not like water you may consider adding a water additive without sugar to improve the taste other sugar free drinks Replace potatoes with a brightly colored vegetable  Use healthy oils, such as canola oil or olive oil, instead of butter or hard margarine Limit your bread intake to two pieces or less a day Replace regular pasta with low carb pasta options Bake, broil, or grill foods instead of frying Monitor portion sizes  Eat smaller, more frequent meals throughout the day instead of large meals  An important thing to remember is, if you love foods that are not great for your health, you don't have to give them up completely. Instead, allow these foods to be a reward when you have done well. Allowing yourself to still have special treats every once in a while is a nice way to tell yourself thank you for working hard to keep yourself healthy.   Also remember that every day is a new day. If you have a bad day and "fall off the wagon", you can still climb right back up and keep moving along on your journey!  We have resources available to help you!  Some websites that may be helpful include: www.MyPlate.gov  Www.VeryWellFit.com _____________________________________________________________  Activity Recommendations for All Patients  I recommend that all adults get at least 20 minutes of moderate physical activity that elevates your heart rate at least 5 days out of the week.  Some examples  include: Walking or jogging at a pace that allows you to carry on a conversation Cycling (stationary bike or outdoors) Water aerobics Yoga Weight lifting Dancing If physical limitations prevent you from putting stress on your joints, exercise in a pool or seated in a chair are excellent options.  Do determine your MAXIMUM heart rate for activity: 220 - YOUR AGE = MAX Heart Rate   Remember! Do not push yourself too hard.  Start slowly and build up your pace, speed, weight, time in exercise, etc.  Allow your body to rest between exercise and get good sleep. You will need more water than normal when you are exerting yourself. Do not wait until you are thirsty to drink. Drink with a purpose of getting in at least 8, 8 ounce glasses of water a day plus more depending on how much you exercise and sweat.    If you begin to develop dizziness, chest pain, abdominal pain, jaw pain, shortness of breath, headache, vision changes, lightheadedness, or other concerning symptoms,   stop the activity and allow your body to rest. If your symptoms are severe, seek emergency evaluation immediately. If your symptoms are concerning, but not severe, please let us know so that we can recommend further evaluation.     

## 2023-03-31 ENCOUNTER — Ambulatory Visit (INDEPENDENT_AMBULATORY_CARE_PROVIDER_SITE_OTHER): Payer: Managed Care, Other (non HMO) | Admitting: Family Medicine

## 2023-03-31 ENCOUNTER — Encounter: Payer: Self-pay | Admitting: Family Medicine

## 2023-03-31 VITALS — BP 118/73 | HR 79 | Ht 72.0 in | Wt 151.0 lb

## 2023-03-31 DIAGNOSIS — Z1329 Encounter for screening for other suspected endocrine disorder: Secondary | ICD-10-CM

## 2023-03-31 DIAGNOSIS — Z0001 Encounter for general adult medical examination with abnormal findings: Secondary | ICD-10-CM

## 2023-03-31 DIAGNOSIS — Z23 Encounter for immunization: Secondary | ICD-10-CM

## 2023-03-31 DIAGNOSIS — Z13220 Encounter for screening for lipoid disorders: Secondary | ICD-10-CM

## 2023-03-31 DIAGNOSIS — L989 Disorder of the skin and subcutaneous tissue, unspecified: Secondary | ICD-10-CM

## 2023-03-31 DIAGNOSIS — Z131 Encounter for screening for diabetes mellitus: Secondary | ICD-10-CM

## 2023-03-31 DIAGNOSIS — Z113 Encounter for screening for infections with a predominantly sexual mode of transmission: Secondary | ICD-10-CM

## 2023-03-31 DIAGNOSIS — Z1159 Encounter for screening for other viral diseases: Secondary | ICD-10-CM

## 2023-03-31 DIAGNOSIS — Z0000 Encounter for general adult medical examination without abnormal findings: Secondary | ICD-10-CM

## 2023-03-31 MED ORDER — DOXYCYCLINE HYCLATE 100 MG PO TABS: 100.0000 mg | ORAL_TABLET | Freq: Two times a day (BID) | ORAL | 2 refills | Status: DC

## 2023-03-31 NOTE — Patient Instructions (Addendum)
Skin lesions consistent with hidradenitis - see information attached.  Starting 3 months of doxycyline and referring to dermatology to further manage.  Routine physical labs today and STD screening

## 2023-03-31 NOTE — Progress Notes (Signed)
Complete physical exam  Patient: Damon Hoffman   DOB: August 27, 1996   27 y.o. Male  MRN: [id number]  Subjective:    Chief Complaint  Patient presents with   Annual Exam    Damon Hoffman is a 27 y.o. male who presents today for a complete physical exam. He reports consuming a general diet. The patient does not participate in regular exercise at present. He generally feels well. He reports sleeping well. He does have additional problems to discuss today.   Currently lives with: mom Interim Problems from her last visit:  States he got really good imprvoement with doxy for skin abscess, but symptoms returned after stopping medication. States these areas are popping up frquently. He has never seen a dermatoligst before. Sometimes severe enough it hurts to walk until they pop on their own - he does not mess with it.    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 0  Number falls in past yr: 0  Injury with Fall? 0  Risk for fall due to : No Fall Risks  Follow up Falls evaluation completed     Most recent depression screenings:    03/31/2023    2:50 PM 03/10/2023   10:53 AM  PHQ 2/9 Scores  PHQ - 2 Score 0 1  PHQ- 9 Score 1 2            Patient Care Team: Clayborne Dana, NP as PCP - General (Family Medicine)   Outpatient Medications Prior to Visit  Medication Sig   [DISCONTINUED] cetirizine (ZYRTEC) 10 MG tablet Take 1 tablet (10 mg total) by mouth daily.   No facility-administered medications prior to visit.    ROS All review of systems negative except what is listed in the HPI        Objective:     BP 118/73   Pulse 79   Ht 6' (1.829 m)   Wt 151 lb  (68.5 kg)   SpO2 98%   BMI 20.48 kg/m    Physical Exam Vitals reviewed.  Constitutional:      General: He is not in acute distress.    Appearance: Normal appearance. He is not ill-appearing.  HENT:     Head: Normocephalic and atraumatic.     Right Ear: Tympanic membrane normal.     Left Ear: Tympanic membrane normal.     Nose: Nose normal.     Mouth/Throat:     Mouth: Mucous membranes are moist.     Pharynx: Oropharynx is clear.  Eyes:     Extraocular Movements: Extraocular movements intact.     Conjunctiva/sclera: Conjunctivae normal.     Pupils: Pupils are equal, round, and reactive to light.  Neck:     Vascular: No carotid bruit.  Cardiovascular:     Rate and Rhythm: Normal rate and regular rhythm.     Pulses: Normal pulses.     Heart sounds: Normal heart sounds.  Pulmonary:     Effort: Pulmonary effort is normal.     Breath sounds: Normal breath sounds.  Abdominal:     General: Abdomen is flat. Bowel sounds are normal. There is no distension.  Palpations: Abdomen is soft. There is no mass.     Tenderness: There is no abdominal tenderness. There is no right CVA tenderness, left CVA tenderness, guarding or rebound.  Genitourinary:    Comments: Deferred exam Musculoskeletal:        General: Normal range of motion.     Cervical back: Normal range of motion and neck supple. No tenderness.     Right lower leg: No edema.     Left lower leg: No edema.  Lymphadenopathy:     Cervical: No cervical adenopathy.  Skin:    General: Skin is warm and dry.     Capillary Refill: Capillary refill takes less than 2 seconds.     Comments: Bilateral groin with several small lesions (some draining) consistent with hidradenitis suppurativa  Neurological:     General: No focal deficit present.     Mental Status: He is alert and oriented to person, place, and time. Mental status is at baseline.  Psychiatric:        Mood and Affect: Mood normal.        Behavior: Behavior normal.         Thought Content: Thought content normal.        Judgment: Judgment normal.      No results found for any visits on 03/31/23.     Assessment & Plan:    Routine Health Maintenance and Physical Exam Discussed health promotion and safety including diet and exercise recommendations, dental health, and injury prevention. Tobacco cessation if applicable. Seat belts, sunscreen, smoke detectors, etc.    Immunization History  Administered Date(s) Administered   DTaP 04/16/1998, 06/20/1998, 09/03/1998, 04/25/1999, 08/24/2002   HIB (PRP-OMP) 04/16/1998, 06/20/1998, 04/25/1999   HPV 9-valent 08/22/2014, 03/31/2023   Hepatitis A, Ped/Adol-2 Dose 06/08/2007, 07/12/2008   Hepatitis B, PED/ADOLESCENT 03/05/1998, 03/07/1998, 04/25/1999   IPV 04/16/1998, 06/20/1998, 09/03/1998, 08/24/2002   MMR 04/25/1999, 08/24/2002   MenQuadfi_Meningococcal Groups ACYW Conjugate 07/26/2009   Tdap 09/24/2015   Varicella 04/25/1999, 06/08/2007    Health Maintenance  Topic Date Due   HIV Screening  Never done   Hepatitis C Screening  Never done   HPV VACCINES (3 - Male 3-dose series) 06/23/2023   COVID-19 Vaccine (1) 03/29/2024 (Originally 08/05/1998)   INFLUENZA VACCINE  07/15/2023   DTaP/Tdap/Td (7 - Td or Tdap) 09/23/2025        Problem List Items Addressed This Visit   None Visit Diagnoses     Annual physical exam    -  Primary   Relevant Orders   CBC with Differential/Platelet   Comprehensive metabolic panel   Lipid panel   TSH   Diabetes mellitus screening       Relevant Orders   Comprehensive metabolic panel   Lipid screening       Relevant Orders   Lipid panel   Thyroid disorder screen       Relevant Orders   TSH   Need for HPV vaccination       Relevant Orders   HPV 9-valent vaccine,Recombinat (Completed)   Skin lesions     History and presentation concerning for hidradenitis suppurativa. Will go ahead and start longer course of doxy and refer to dermatology. Discussed skin  care.     Relevant Medications   doxycycline (VIBRA-TABS) 100 MG tablet   Other Relevant Orders   Ambulatory referral to Dermatology   Screen for STD (sexually transmitted disease)       Relevant Orders   Urine cytology ancillary only  HIV Antibody (routine testing w rflx)   HSV(herpes simplex vrs) 1+2 ab-IgG   RPR   Encounter for hepatitis C screening test for low risk patient       Relevant Orders   Hepatitis C antibody      Return in about 3 months (around 06/30/2023) for routine follow-up.     Clayborne Dana, NP

## 2023-04-01 LAB — LIPID PANEL
Cholesterol: 135 mg/dL (ref 0–200)
HDL: 45.1 mg/dL (ref >39.00)
LDL Cholesterol: 80 mg/dL (ref 0–99)
NonHDL: 89.54
Total CHOL/HDL Ratio: 3
Triglycerides: 47 mg/dL (ref 0.0–149.0)
VLDL: 9.4 mg/dL (ref 0.0–40.0)

## 2023-04-01 LAB — CBC WITH DIFFERENTIAL/PLATELET
Basophils Absolute: 0.1 10*3/uL (ref 0.0–0.1)
Basophils Relative: 1.4 % (ref 0.0–3.0)
Eosinophils Absolute: 0.2 10*3/uL (ref 0.0–0.7)
Eosinophils Relative: 2.6 % (ref 0.0–5.0)
HCT: 43.9 % (ref 39.0–52.0)
Hemoglobin: 15.4 g/dL (ref 13.0–17.0)
Lymphocytes Relative: 30.3 % (ref 12.0–46.0)
Lymphs Abs: 2.1 10*3/uL (ref 0.7–4.0)
MCHC: 35.2 g/dL (ref 30.0–36.0)
MCV: 83.7 fl (ref 78.0–100.0)
Monocytes Absolute: 0.5 10*3/uL (ref 0.1–1.0)
Monocytes Relative: 7.2 % (ref 3.0–12.0)
Neutro Abs: 4 10*3/uL (ref 1.4–7.7)
Neutrophils Relative %: 58.5 % (ref 43.0–77.0)
Platelets: 151 10*3/uL (ref 150.0–400.0)
RBC: 5.25 Mil/uL (ref 4.22–5.81)
RDW: 14.5 % (ref 11.5–15.5)
WBC: 6.9 10*3/uL (ref 4.0–10.5)

## 2023-04-01 LAB — RPR: RPR Ser Ql: NONREACTIVE

## 2023-04-01 LAB — COMPREHENSIVE METABOLIC PANEL
ALT: 12 U/L (ref 0–53)
AST: 19 U/L (ref 0–37)
Albumin: 4.4 g/dL (ref 3.5–5.2)
Alkaline Phosphatase: 63 U/L (ref 39–117)
BUN: 11 mg/dL (ref 6–23)
CO2: 28 mEq/L (ref 19–32)
Calcium: 9.4 mg/dL (ref 8.4–10.5)
Chloride: 104 mEq/L (ref 96–112)
Creatinine, Ser: 0.97 mg/dL (ref 0.40–1.50)
GFR: 108.77 mL/min (ref >60.00)
Glucose, Bld: 74 mg/dL (ref 70–99)
Potassium: 4 mEq/L (ref 3.5–5.1)
Sodium: 139 mEq/L (ref 135–145)
Total Bilirubin: 0.9 mg/dL (ref 0.2–1.2)
Total Protein: 6.9 g/dL (ref 6.0–8.3)

## 2023-04-01 LAB — HEPATITIS C ANTIBODY: Hepatitis C Ab: NONREACTIVE

## 2023-04-01 LAB — HSV(HERPES SIMPLEX VRS) I + II AB-IGG
HAV 1 IGG,TYPE SPECIFIC AB: <0.9 index
HSV 2 IGG,TYPE SPECIFIC AB: <0.9 index

## 2023-04-01 LAB — HIV ANTIBODY (ROUTINE TESTING W REFLEX): HIV 1&2 Ab, 4th Generation: NONREACTIVE

## 2023-04-01 LAB — TSH: TSH: 1.91 u[IU]/mL (ref 0.35–5.50)

## 2023-05-20 ENCOUNTER — Encounter (HOSPITAL_BASED_OUTPATIENT_CLINIC_OR_DEPARTMENT_OTHER): Payer: Self-pay | Admitting: Emergency Medicine

## 2023-05-20 ENCOUNTER — Emergency Department (HOSPITAL_BASED_OUTPATIENT_CLINIC_OR_DEPARTMENT_OTHER)
Admission: EM | Admit: 2023-05-20 | Discharge: 2023-05-20 | Disposition: A | Discharge status: Home / Self Care | Payer: Managed Care, Other (non HMO) | Attending: Emergency Medicine | Admitting: Emergency Medicine

## 2023-05-20 ENCOUNTER — Other Ambulatory Visit: Payer: Self-pay

## 2023-05-20 DIAGNOSIS — L0231 Cutaneous abscess of buttock: Secondary | ICD-10-CM | POA: Diagnosis present

## 2023-05-20 MED ORDER — CEPHALEXIN 500 MG PO CAPS: 500.0000 mg | ORAL_CAPSULE | Freq: Four times a day (QID) | ORAL | 0 refills | Status: AC

## 2023-05-20 NOTE — ED Triage Notes (Signed)
Abscess on right buttocks x 3 days.

## 2023-05-20 NOTE — Discharge Instructions (Signed)
Soak area 20 minutes 4 times a day for the next 2 days.  Return if symptoms worsen or cahnge.

## 2023-05-20 NOTE — ED Provider Notes (Signed)
[place] [facility] Provider Note   CSN: [id number] Arrival date & time: 05/20/23  1305     History  Chief Complaint  Patient presents with   Abscess    Damon Hoffman is a 27 y.o. male.  Pt complains of swelling on buttock.    The history is provided by the patient. No language interpreter was used.  Abscess Location:  Pelvis Pelvic abscess location:  R buttock Size:  2 Abscess quality: redness and warmth   Chronicity:  New Relieved by:  Nothing Worsened by:  Nothing Ineffective treatments:  None tried Associated symptoms: no vomiting        Home Medications Prior to Admission medications   Medication Sig Start Date End Date Taking? Authorizing Provider  cephALEXin (KEFLEX) 500 MG capsule Take 1 capsule (500 mg total) by mouth 4 (four) times daily for 10 days. 05/20/23 05/30/23 Yes Elson Areas, PA-C  doxycycline (VIBRA-TABS) 100 MG tablet Take 1 tablet (100 mg total) by mouth 2 (two) times daily. 03/31/23   Clayborne Dana, NP      Allergies    Sulfa antibiotics    Review of Systems   Review of Systems  Gastrointestinal:  Negative for vomiting.  All other systems reviewed and are negative.   Physical Exam Updated Vital Signs Ht 6' (1.829 m)   Wt 71.7 kg   BMI 21.43 kg/m  Physical Exam Vitals and nursing note reviewed.  Constitutional:      Appearance: He is well-developed.  HENT:     Head: Normocephalic.  Cardiovascular:     Rate and Rhythm: Normal rate.  Pulmonary:     Effort: Pulmonary effort is normal.  Abdominal:     General: There is no distension.  Musculoskeletal:        General: Swelling and tenderness present.     Cervical back: Normal range of motion.     Comments: 2cm area buttock,  no fluctuance  Skin:    General: Skin is warm.  Neurological:     General: No focal deficit present.     Mental Status: He is alert and oriented to person, place, and time.  Psychiatric:        Mood and Affect:  Mood normal.     ED Results / Procedures / Treatments   Labs (all labs ordered are listed, but only abnormal results are displayed) Labs Reviewed - No data to display  EKG None  Radiology No results found.  Procedures Procedures    Medications Ordered in ED Medications - No data to display  ED Course/ Medical Decision Making/ A&P                             Medical Decision Making Pt complains of swelling in buttock.    Risk Prescription drug management. Risk Details: Pt advised to soak area 20 minutes 4 times a day Rx for Keflex           Final Clinical Impression(s) / ED Diagnoses Final diagnoses:  Abscess of buttock, right    Rx / DC Orders ED Discharge Orders          Ordered    cephALEXin (KEFLEX) 500 MG capsule  4 times daily        05/20/23 1427           An After Visit Summary was printed and given to the patient.  Elson Areas, [place] 05/20/23 1432    Gwyneth Sprout, MD 05/24/23 [phone]

## 2023-06-21 ENCOUNTER — Encounter: Payer: Self-pay | Admitting: Family Medicine

## 2023-06-21 ENCOUNTER — Ambulatory Visit (INDEPENDENT_AMBULATORY_CARE_PROVIDER_SITE_OTHER): Payer: Managed Care, Other (non HMO) | Admitting: Family Medicine

## 2023-06-21 VITALS — BP 118/70 | HR 86 | Temp 97.6°F | Resp 18 | Ht 72.0 in | Wt 152.6 lb

## 2023-06-21 DIAGNOSIS — L989 Disorder of the skin and subcutaneous tissue, unspecified: Secondary | ICD-10-CM | POA: Diagnosis not present

## 2023-06-21 MED ORDER — DOXYCYCLINE HYCLATE 100 MG PO TABS: 100.0000 mg | ORAL_TABLET | Freq: Every day | ORAL | 0 refills | Status: DC

## 2023-06-21 MED ORDER — CHLORHEXIDINE GLUCONATE 4 % EX SOLN: Freq: Every day | CUTANEOUS | 5 refills | Status: DC | PRN

## 2023-06-21 NOTE — Patient Instructions (Addendum)
[facility]: [phone]  [facility]: [phone]   We'll try decreasing doxy to once daily while waiting to get in with dermatology. Adding chlorhexidine wash to try. Call dermatology to schedule an appointment with them.

## 2023-06-21 NOTE — Progress Notes (Signed)
Established Patient Office Visit  Subjective   Patient ID: Damon Hoffman, male    DOB: [birth year]  Age: 27 y.o. MRN: [id number]  Chief Complaint  Patient presents with   Follow up ER    05/20/23 abscess of R buttock- Rx: Cephalexin. Pt says he has felt better since the ER visit.    3 month follow up    Concerns/ questions: none     HPI   Discussed the use of AI scribe software for clinical note transcription with the patient, who gave verbal consent to proceed.  History of Present Illness   The patient, with a history of recurrent abscesses, presented for a follow-up after an ER visit in early June due to a painful abscess on his buttocks. The abscess was not drained at the ER, but it ruptured spontaneously two days later, causing significant discomfort and difficulty walking. The patient was prescribed Keflex, which effectively resolved the abscess.  Since the ER visit, the patient has not had any more abscesses of similar severity. He has experienced sensations suggestive of an impending abscess, including localized hardness but these have not progressed to full abscesses. The patient has been on a three-month course of doxycycline, which he believes may be preventing the abscesses from fully forming.  The patient's abscesses are primarily located in the groin area, often recurring in the same spots. He has a history of similar issues in armpits, but this has not been a problem in recent years. The patient has been referred to dermatology for further evaluation and management of these recurrent abscesses, but he has not yet been contacted for an appointment.  The patient has been using Dove sensitive soap for skincare and has not introduced any new medications or treatments. She has been sexually abstinent recently and maintains good personal hygiene. Recent STD screenings, including HSV, syphilis, and HIV, have returned negative results.          ROS All review of systems  negative except what is listed in the HPI    Objective:     BP 118/70 (BP Location: Left Arm, Patient Position: Sitting, Cuff Size: Normal)   Pulse 86   Temp 97.6 F (36.4 C) (Oral)   Resp 18   Ht 6' (1.829 m)   Wt 152 lb 9.6 oz (69.2 kg)   SpO2 99%   BMI 20.70 kg/m    Physical Exam Vitals reviewed.  Constitutional:      Appearance: Normal appearance.  Cardiovascular:     Rate and Rhythm: Normal rate and regular rhythm.     Heart sounds: Normal heart sounds.  Skin:    General: Skin is warm and dry.     Comments: Healing lesions to bilateral groin/scrotum  Neurological:     Mental Status: He is alert and oriented to person, place, and time.  Psychiatric:        Mood and Affect: Mood normal.        Behavior: Behavior normal.        Thought Content: Thought content normal.        Judgment: Judgment normal.      No results found for any visits on 06/21/23.    The ASCVD Risk score (Arnett DK, et al., 2019) failed to calculate for the following reasons:   The 2019 ASCVD risk score is only valid for ages 75 to 16    Assessment & Plan:   Problem List Items Addressed This Visit   None Visit Diagnoses  Skin lesions    -  Primary Decreasing doxy to daily dosing and adding Hibiclens for possible hidradenitis vs other skin lesion conditions. STD testing at last visit was negative for HSV, HIV, and syphilis. No other symptoms. Current lesions are much improved compared to baseline since starting doxy trial 2-3 months ago.  Patient aware of signs/symptoms requiring further/urgent evaluation.    Relevant Medications   chlorhexidine (HIBICLENS) 4 % external liquid   doxycycline (VIBRA-TABS) 100 MG tablet       Return if symptoms worsen or fail to improve.    Clayborne Dana, NP

## 2023-06-30 ENCOUNTER — Ambulatory Visit: Payer: Managed Care, Other (non HMO) | Admitting: Family Medicine

## 2023-08-19 ENCOUNTER — Other Ambulatory Visit: Payer: Self-pay | Admitting: Family Medicine

## 2023-08-19 DIAGNOSIS — L989 Disorder of the skin and subcutaneous tissue, unspecified: Secondary | ICD-10-CM

## 2023-08-22 ENCOUNTER — Emergency Department (HOSPITAL_COMMUNITY): Payer: Managed Care, Other (non HMO)

## 2023-08-22 ENCOUNTER — Other Ambulatory Visit: Payer: Self-pay

## 2023-08-22 ENCOUNTER — Emergency Department (HOSPITAL_COMMUNITY)
Admission: EM | Admit: 2023-08-22 | Discharge: 2023-08-22 | Disposition: A | Discharge status: Home / Self Care | Payer: Managed Care, Other (non HMO) | Attending: Emergency Medicine | Admitting: Emergency Medicine

## 2023-08-22 DIAGNOSIS — S62602A Fracture of unspecified phalanx of right middle finger, initial encounter for closed fracture: Secondary | ICD-10-CM | POA: Diagnosis present

## 2023-08-22 DIAGNOSIS — S62622A Displaced fracture of medial phalanx of right middle finger, initial encounter for closed fracture: Secondary | ICD-10-CM

## 2023-08-22 DIAGNOSIS — S0101XA Laceration without foreign body of scalp, initial encounter: Secondary | ICD-10-CM | POA: Diagnosis not present

## 2023-08-22 DIAGNOSIS — Z23 Encounter for immunization: Secondary | ICD-10-CM | POA: Diagnosis not present

## 2023-08-22 DIAGNOSIS — S0181XA Laceration without foreign body of other part of head, initial encounter: Secondary | ICD-10-CM | POA: Diagnosis not present

## 2023-08-22 MED ORDER — TETANUS-DIPHTH-ACELL PERTUSSIS 5-2.5-18.5 LF-MCG/0.5 IM SUSY
0.5000 mL | PREFILLED_SYRINGE | Freq: Once | INTRAMUSCULAR | Status: AC
  Administered 2023-08-22: 0.5 mL via INTRAMUSCULAR
  Filled 2023-08-22: qty 0.5

## 2023-08-22 MED ORDER — AMOXICILLIN-POT CLAVULANATE 875-125 MG PO TABS
1.0000 | ORAL_TABLET | Freq: Once | ORAL | Status: AC
  Administered 2023-08-22: 1 via ORAL
  Filled 2023-08-22: qty 1

## 2023-08-22 MED ORDER — KETOROLAC TROMETHAMINE 30 MG/ML IJ SOLN
30.0000 mg | Freq: Once | INTRAMUSCULAR | Status: AC
  Administered 2023-08-22: 30 mg via INTRAMUSCULAR

## 2023-08-22 MED ORDER — DICLOFENAC SODIUM ER 100 MG PO TB24: 100.0000 mg | ORAL_TABLET | Freq: Every day | ORAL | 0 refills | Status: DC

## 2023-08-22 MED ORDER — METAXALONE 800 MG PO TABS: 800.0000 mg | ORAL_TABLET | Freq: Three times a day (TID) | ORAL | 0 refills | Status: DC

## 2023-08-22 MED ORDER — METHOCARBAMOL 500 MG PO TABS
1000.0000 mg | ORAL_TABLET | Freq: Once | ORAL | Status: AC
  Administered 2023-08-22: 1000 mg via ORAL
  Filled 2023-08-22: qty 2

## 2023-08-22 MED ORDER — KETOROLAC TROMETHAMINE 30 MG/ML IJ SOLN
30.0000 mg | Freq: Once | INTRAMUSCULAR | Status: DC
  Filled 2023-08-22: qty 1

## 2023-08-22 MED ORDER — LIDOCAINE-EPINEPHRINE-TETRACAINE (LET) TOPICAL GEL
3.0000 mL | Freq: Once | TOPICAL | Status: AC
  Administered 2023-08-22: 3 mL via TOPICAL
  Filled 2023-08-22: qty 3

## 2023-08-22 MED ORDER — HYDROCODONE-ACETAMINOPHEN 5-325 MG PO TABS: 1.0000 | ORAL_TABLET | Freq: Four times a day (QID) | ORAL | 0 refills | Status: DC | PRN

## 2023-08-22 NOTE — ED Notes (Signed)
Pt in CT.
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
## 2023-08-22 NOTE — ED Notes (Signed)
Pt right middle finger cleaned and dressed
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
## 2023-08-22 NOTE — Discharge Instructions (Addendum)
No drinking alcohol, driving a care or operating heavy machinery while taking narcotic pain medication

## 2023-08-27 ENCOUNTER — Ambulatory Visit (HOSPITAL_COMMUNITY)
Admission: EM | Admit: 2023-08-27 | Discharge: 2023-08-27 | Disposition: A | Discharge status: Home / Self Care | Payer: Managed Care, Other (non HMO) | Attending: Emergency Medicine | Admitting: Emergency Medicine

## 2023-08-27 ENCOUNTER — Encounter (HOSPITAL_COMMUNITY): Payer: Self-pay

## 2023-08-27 DIAGNOSIS — Z4802 Encounter for removal of sutures: Secondary | ICD-10-CM
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
## 2023-08-27 NOTE — Discharge Instructions (Signed)
We remove the 8 staples in your head today.  Please gently cleanse the area with warm water and antibacterial soap like Dial, pat dry and apply topical Neosporin.  Return to clinic or seek follow-up care if you develop any redness, streaking, fever, or purulent drainage, as these are signs of infection.  The sutures placed on your face are Vicryl and absorbable.  You can cleanse these with warm water and antibacterial solution as well, applying Neosporin.  To clinic for any new or urgent symptoms.

## 2023-08-27 NOTE — ED Triage Notes (Signed)
Pt states he is here to have the staples removed from the top of his head.

## 2023-08-30 ENCOUNTER — Ambulatory Visit (INDEPENDENT_AMBULATORY_CARE_PROVIDER_SITE_OTHER): Payer: Managed Care, Other (non HMO) | Admitting: Family Medicine

## 2023-08-30 ENCOUNTER — Encounter: Payer: Self-pay | Admitting: Family Medicine

## 2023-08-30 VITALS — BP 118/74 | HR 68 | Ht 72.0 in | Wt 146.0 lb

## 2023-08-30 DIAGNOSIS — R1013 Epigastric pain: Secondary | ICD-10-CM

## 2023-08-30 MED ORDER — PANTOPRAZOLE SODIUM 40 MG PO TBEC
40.0000 mg | DELAYED_RELEASE_TABLET | Freq: Every day | ORAL | 3 refills | Status: DC
Start: 2023-08-30 — End: 2023-11-29
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
## 2023-09-07 ENCOUNTER — Other Ambulatory Visit: Payer: Self-pay | Admitting: Family Medicine

## 2023-09-07 DIAGNOSIS — L989 Disorder of the skin and subcutaneous tissue, unspecified: Secondary | ICD-10-CM

## 2023-11-27 ENCOUNTER — Other Ambulatory Visit: Payer: Self-pay | Admitting: Family Medicine

## 2023-11-27 DIAGNOSIS — R1013 Epigastric pain: Secondary | ICD-10-CM
